# Patient Record
Sex: Male | Born: 2000 | Race: Black or African American | Hispanic: No | Marital: Single | Smoking: Never smoker
Health system: Southern US, Community
[De-identification: ages and names within clinical notes are randomized; demographics above are authoritative.]

## PROBLEM LIST (undated history)

## (undated) HISTORY — PX: ORCHIOPEXY: SHX479

---
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
## 2018-01-25 ENCOUNTER — Encounter: Payer: Self-pay | Admitting: Sports Medicine

## 2018-01-25 ENCOUNTER — Ambulatory Visit (INDEPENDENT_AMBULATORY_CARE_PROVIDER_SITE_OTHER): Payer: 59 | Admitting: Sports Medicine

## 2018-01-25 DIAGNOSIS — M2142 Flat foot [pes planus] (acquired), left foot: Secondary | ICD-10-CM | POA: Diagnosis not present

## 2018-01-25 DIAGNOSIS — M2141 Flat foot [pes planus] (acquired), right foot: Secondary | ICD-10-CM

## 2018-01-25 NOTE — Patient Instructions (Signed)
Today we made custom orthotics. Try them out in your basketball shoes. If you are still having foot pain after 1-2 weeks, come back and we can make some minor alterations in them. Good luck in this year's basketball season.

## 2018-01-25 NOTE — Assessment & Plan Note (Signed)
Patient was fitted for a : standard, cushioned, semi-rigid orthotic. The orthotic was heated and afterward the patient stood on the orthotic blank positioned on the orthotic stand. The patient was positioned in subtalar neutral position and 10 degrees of ankle dorsiflexion in a weight bearing stance. After completion of molding, a stable base was applied to the orthotic blank. The blank was ground to a stable position for weight bearing. Size: 14 Base: EVA Posting: none Additional orthotic padding: none  Total time spent with the patient was 30 minutes with greater than 50% of the time spent in face-to-face consultation doing gait analysis, construction, and customization of custom orthotics.

## 2018-01-25 NOTE — Progress Notes (Signed)
  Devin Hernandez - 17 y.o. 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  He has a history of pes planus which he has required custom orthotics for.  His current orthotics are 362 to 19 years old and are worn out.  He states that he is beginning to feel pain on the medial longitudinal arch on his feet bilaterally.  This is worse with toe off exercises and jumping up for a rebound in basketball.  It does improve with relative rest, ice and anti-inflammatory medications.  He denies any significant ankle pain or ankle instability.  Denies any calf tenderness or recent injuries in basketball.  He expresses the desire to have custom orthotics made today.  Denies any numbness or tingling of his extremities.  Denies any knee pain.   Review of systems:  As stated above   Interval past medical history, surgical history, family history, and social history obtained and are unchanged.   He did have a history of low back pain which has resolved.  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  Family history is significant for hypertension and diabetes.  Surgical history significant for a lateral meniscectomy and left knee arthroscopy in 2017.  Medications reviewed and unchanged. Allergies: No known drug allergies  OBJECTIVE:  Physical exam: Vital signs are reviewed. BP 120/70   Ht 6\' 5"  (1.956 m)   Wt 222 lb (100.7 kg)   BMI 26.33 kg/m   Gen.: Alert, oriented, appears stated age, in no apparent distress Gait: normal without associated limp Musculoskeletal: Inspection of bilateral feet demonstrates total collapse of medial longitudinal arch with weight bearing. Midfoot medial arch tenderness to palpation. Full ankle ROM in  dorsiflexion/plantarflexion. Strength 5/5 in DF/PF.  Able to go up on his forefeet without any significant problems.  Neurovascularly intact.    ASSESSMENT & PLAN: Bilateral pes planus Patient was fitted for a : standard, cushioned, semi-rigid orthotic. The orthotic was heated and afterward the patient stood on the orthotic blank positioned on the orthotic stand. The patient was positioned in subtalar neutral position and 10 degrees of ankle dorsiflexion in a weight bearing stance. After completion of molding, a stable base was applied to the orthotic blank. The blank was ground to a stable position for weight bearing. Size: 14 Base: EVA Posting: none Additional orthotic padding: none  Total time spent with the patient was 25 minutes with greater than 50% of the time spent in face-to-face consultation doing gait analysis, construction, and customization of custom orthotics.    Gustavus MessingAJ Anniyah Mood, DO Sports Medicine Fellow [facility]

## 2018-05-13 ENCOUNTER — Encounter (HOSPITAL_COMMUNITY): Payer: Self-pay | Admitting: *Deleted

## 2018-05-13 ENCOUNTER — Other Ambulatory Visit: Payer: Self-pay

## 2018-05-13 ENCOUNTER — Emergency Department (HOSPITAL_COMMUNITY)
Admission: EM | Admit: 2018-05-13 | Discharge: 2018-05-13 | Disposition: A | Discharge status: Home / Self Care | Payer: 59 | Attending: Emergency Medicine | Admitting: Emergency Medicine

## 2018-05-13 DIAGNOSIS — H6692 Otitis media, unspecified, left ear: Secondary | ICD-10-CM | POA: Diagnosis not present

## 2018-05-13 DIAGNOSIS — Z79899 Other long term (current) drug therapy: Secondary | ICD-10-CM | POA: Insufficient documentation

## 2018-05-13 DIAGNOSIS — R001 Bradycardia, unspecified: Secondary | ICD-10-CM | POA: Diagnosis not present

## 2018-05-13 DIAGNOSIS — H9202 Otalgia, left ear: Secondary | ICD-10-CM | POA: Diagnosis present

## 2018-05-13 MED ORDER — IBUPROFEN 200 MG PO TABS
600.0000 mg | ORAL_TABLET | Freq: Once | ORAL | Status: AC
  Administered 2018-05-13: 600 mg via ORAL
  Filled 2018-05-13: qty 3

## 2018-05-13 MED ORDER — PSEUDOEPHEDRINE HCL 60 MG PO TABS
30.0000 mg | ORAL_TABLET | Freq: Once | ORAL | Status: AC
  Administered 2018-05-13: 30 mg via ORAL
  Filled 2018-05-13: qty 1

## 2018-05-13 MED ORDER — AMOXICILLIN 500 MG PO CAPS: 500.0000 mg | ORAL_CAPSULE | Freq: Three times a day (TID) | ORAL | 0 refills | Status: AC

## 2018-05-13 MED ORDER — AMOXICILLIN 500 MG PO CAPS
500.0000 mg | ORAL_CAPSULE | Freq: Once | ORAL | Status: AC
  Administered 2018-05-13: 500 mg via ORAL
  Filled 2018-05-13: qty 1

## 2018-05-13 MED ORDER — PSEUDOEPHEDRINE HCL 30 MG PO TABS: 30.0000 mg | ORAL_TABLET | Freq: Four times a day (QID) | ORAL | 0 refills | Status: AC | PRN

## 2018-05-13 NOTE — ED Provider Notes (Signed)
[facility] DEPT Provider Note   CSN: [id number] Arrival date & time: 05/13/18  1912     History   Chief Complaint Chief Complaint  Patient presents with  . Otalgia    left    HPI Devin Hernandez is a 18 y.o. male who presents to the ED for left ear pain and nasal congestion. Patient reports he had an away ball game today and went to sleep on the bus and when he woke the congestion was worse and the ear ache was really bad.   HPI  History reviewed. No pertinent past medical history.  Patient Active Problem List   Diagnosis Date Noted  . Bilateral pes planus 01/25/2018  . Acute left-sided low back pain without sciatica 04/27/2017    Past Surgical History:  Procedure Laterality Date  . KNEE ARTHROSCOPY WITH LATERAL MENISECTOMY Left 03/11/2016   Procedure: KNEE ARTHROSCOPY WITH LATERAL [place];  Surgeon: Ninetta Lights, MD;  Location: [facility];  Service: Orthopedics;  Laterality: Left;  . ORCHIOPEXY          Home Medications    Prior to Admission medications   Medication Sig Start Date End Date Taking? Authorizing Provider  amoxicillin (AMOXIL) 500 MG capsule Take 1 capsule (500 mg total) by mouth 3 (three) times daily. 05/13/18   Ashley Murrain, NP  Biotin 1000 MCG tablet Take 1,000 mcg by mouth daily.    [provider]  chlorhexidine gluconate, MEDLINE KIT, (PERIDEX) 0.12 % solution Use as directed 15 mLs in the mouth or throat 2 (two) times daily. 05/26/17   Mackuen, Courteney Lyn, MD  Multiple Vitamins-Minerals (MULTIVITAMIN ADULT PO) Take 1 tablet by mouth daily.    [provider]  naproxen (NAPROSYN) 500 MG tablet Take 1 tablet (500 mg total) by mouth 2 (two) times daily as needed. Patient not taking: Reported on 05/26/2017 04/27/17   McKeag, Marylynn Pearson, MD  Omega-3 Fatty Acids (FISH OIL) 1000 MG CAPS Take 1,000 mg by mouth daily.    [provider]  pseudoephedrine (SUDAFED) 30 MG tablet Take  1 tablet (30 mg total) by mouth every 6 (six) hours as needed for congestion. 05/13/18   Ashley Murrain, NP    Family History Family History  Problem Relation Age of Onset  . Hypertension Father   . Diabetes Father   . Hypertension Maternal Grandfather   . Hypertension Paternal Grandfather   . Diabetes Paternal Grandfather     Social History Social History   Tobacco Use  . Smoking status: Never Smoker  . Smokeless tobacco: Never Used  Substance Use Topics  . Alcohol use: Yes    Comment: occ  . Drug use: No     Allergies   Patient has no known allergies.   Review of Systems Review of Systems  HENT: Positive for congestion and ear pain.   All other systems reviewed and are negative.    Physical Exam Updated Vital Signs BP (!) 143/55 (BP Location: Left Arm)   Pulse (!) 52   Temp (!) 97.5 F (36.4 C) (Oral)   Resp 16   Ht '6\' 5"'  (1.956 m)   Wt 101.2 kg   SpO2 98%   BMI 26.44 kg/m   Physical Exam Vitals signs and nursing note reviewed.  Constitutional:      General: He is not in acute distress.    Appearance: He is well-developed.  HENT:     Head: Normocephalic.     Right  Ear: Tympanic membrane normal.     Left Ear: Tympanic membrane is erythematous and bulging.     Nose: Congestion present.     Mouth/Throat:     Pharynx: Posterior oropharyngeal erythema present.  Eyes:     Conjunctiva/sclera: Conjunctivae normal.  Neck:     Musculoskeletal: Neck supple.  Cardiovascular:     Rate and Rhythm: Bradycardia present.  Pulmonary:     Effort: Pulmonary effort is normal.  Musculoskeletal: Normal range of motion.  Skin:    General: Skin is warm and dry.  Neurological:     Mental Status: He is alert and oriented to person, place, and time.  Psychiatric:        Mood and Affect: Mood normal.      ED Treatments / Results  Labs (all labs ordered are listed, but only abnormal results are displayed) Labs Reviewed - No data to display  Radiology No results  found.  Procedures Procedures (including critical care time)  Medications Ordered in ED Medications  amoxicillin (AMOXIL) capsule 500 mg (has no administration in time range)  pseudoephedrine (SUDAFED) tablet 30 mg (has no administration in time range)     Initial Impression / Assessment and Plan / ED Course  I have reviewed the triage vital signs and the nursing notes.  Patient presents with otalgia and exam consistent with acute otitis media. No concern for acute mastoiditis, meningitis. No antibiotic use in the last month.  Patient discharged home with Amoxicillin.  Advised patient to call PCP for follow-up.  I have also discussed reasons to return immediately to the ER.  Patient expresses understanding and agrees with plan.   Final Clinical Impressions(s) / ED Diagnoses   Final diagnoses:  Acute otitis media, left    ED Discharge Orders         Ordered    amoxicillin (AMOXIL) 500 MG capsule  3 times daily     05/13/18 1937    pseudoephedrine (SUDAFED) 30 MG tablet  Every 6 hours PRN     05/13/18 1937           Debroah Baller [place], NP 05/13/18 1942    Lacretia Leigh, MD 05/13/18 2100

## 2018-05-13 NOTE — ED Triage Notes (Signed)
Pt c/o left ear pain that started today.  Pt presents with nasal congestion also.

## 2018-05-13 NOTE — Discharge Instructions (Addendum)
Follow up for recheck on one week. Return here sooner for any problems. Take tylenol and ibuprofen as needed for pain.

## 2019-02-10 IMAGING — MR MR LUMBAR SPINE W/O CM
4 of 7 series · 21 of 48 positions shown · non-contrast
Comparison: Radiographs dated 04/27/2017

CLINICAL DATA: Acute left-sided low back pain.

EXAM:
MRI LUMBAR SPINE WITHOUT CONTRAST
TECHNIQUE: Multiplanar, multisequence MR imaging of the lumbar spine was
performed. No intravenous contrast was administered.

[Series 16: T2 · sagittal · 4.0mm · 0.73mm/px · 4 of 15 slices shown (1 of 2)]
[im 1/15]
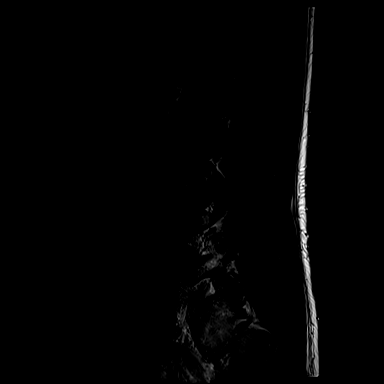
[im 5/15]
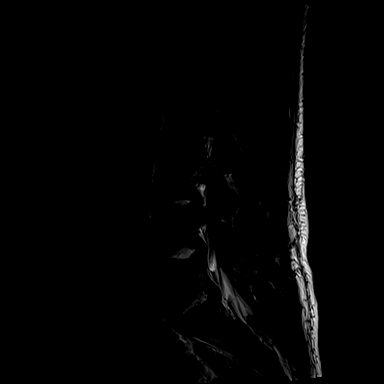
[im 10/15]
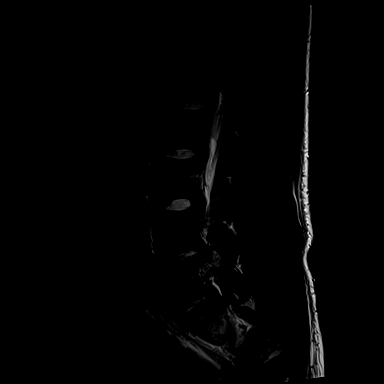
[im 15/15]
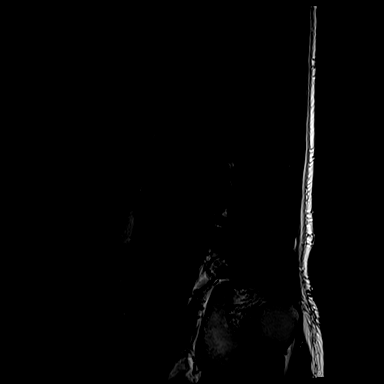

[Series 17: T1 · sagittal · 4.0mm · 0.73mm/px · 5 of 15 slices shown (1 of 2)]
[im 1/15]
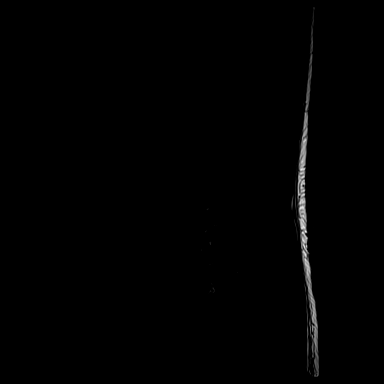
[im 4/15]
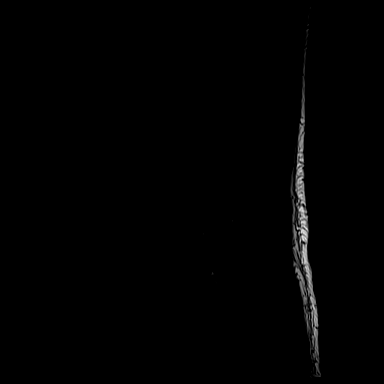
[im 8/15]
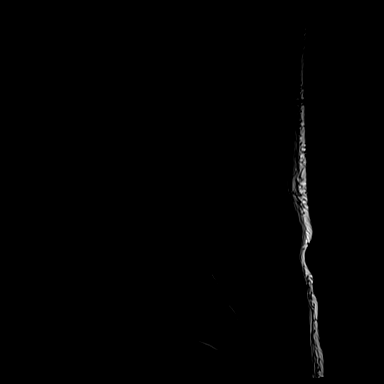
[im 11/15]
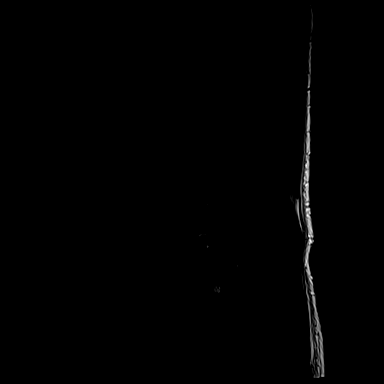
[im 15/15]
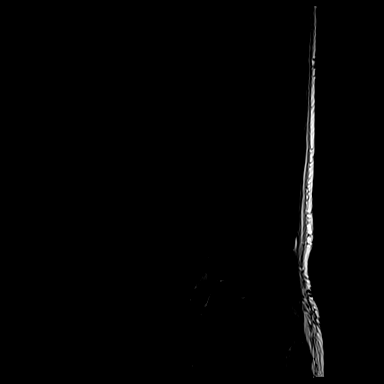

[Series 28: T1 · sagittal · 4.0mm · 0.73mm/px · 3 of 15 slices shown (2 of 2)]
[im 1/15]
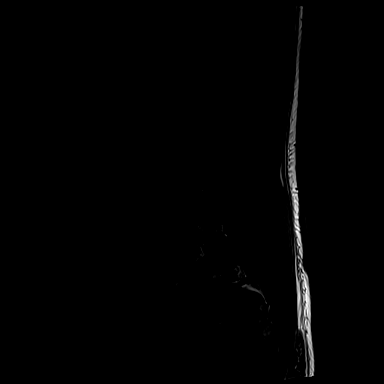
[im 8/15]
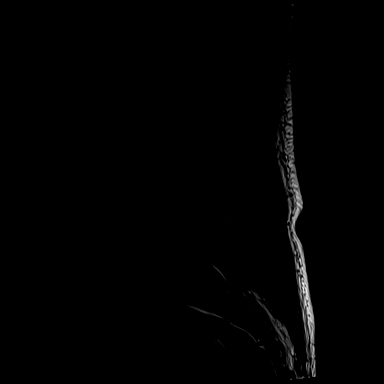
[im 15/15]
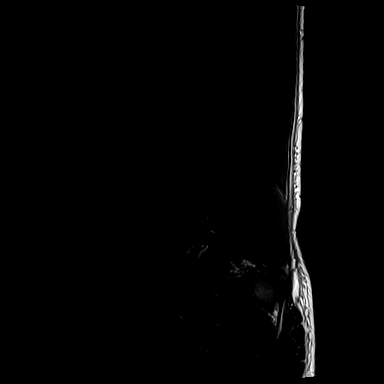

[Series 30: T2 · axial · 4.0mm · 0.28mm/px · z∈[-150,+44]mm · 9 of 36 slices shown (2 of 2)]
[im 1/36]
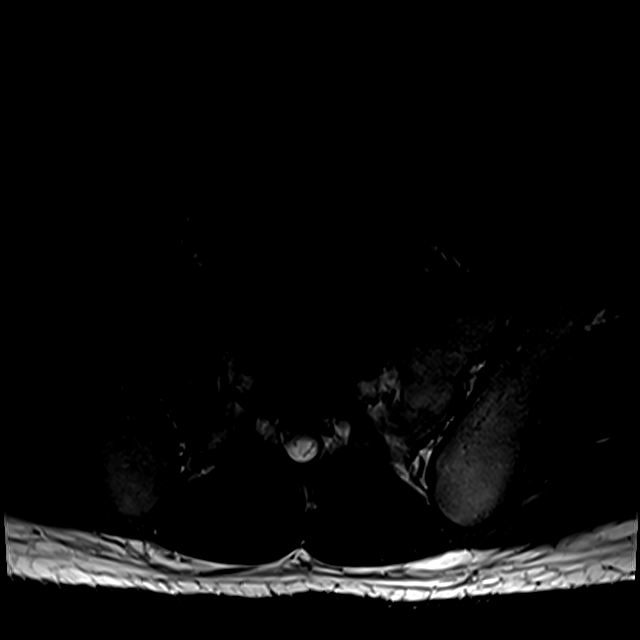
[im 7/36]
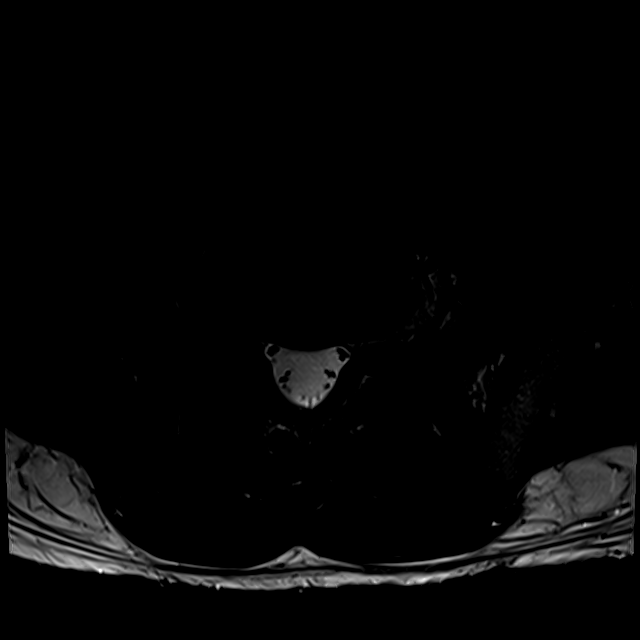
[im 10/36]
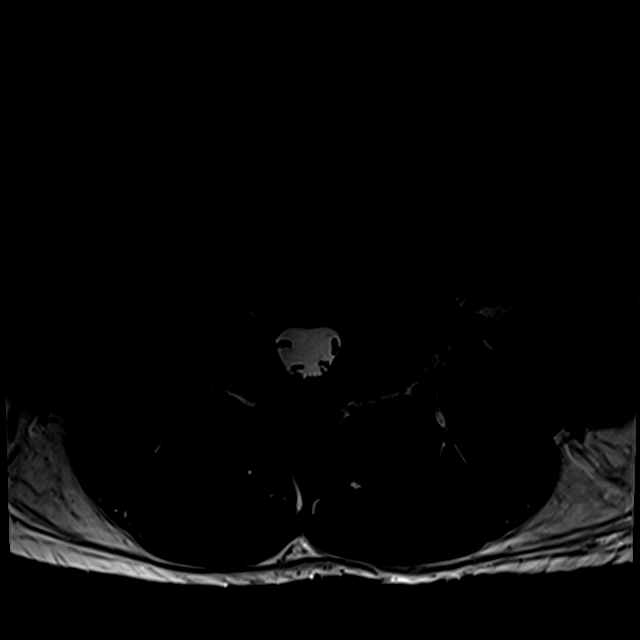
[im 16/36]
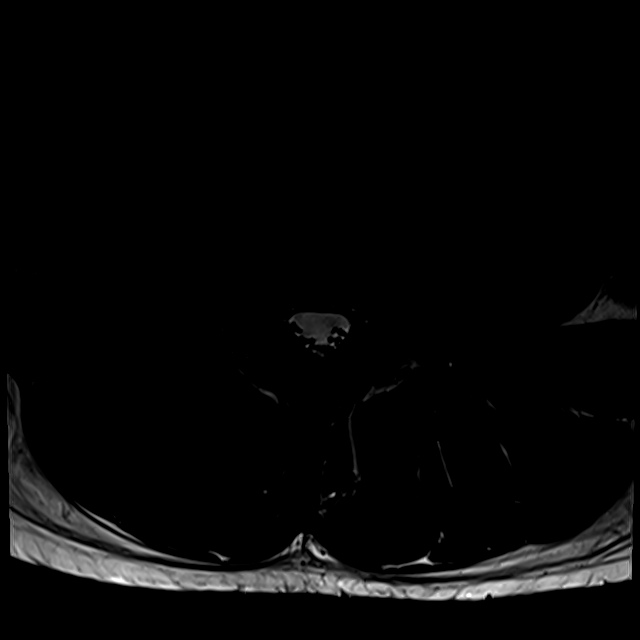
[im 20/36]
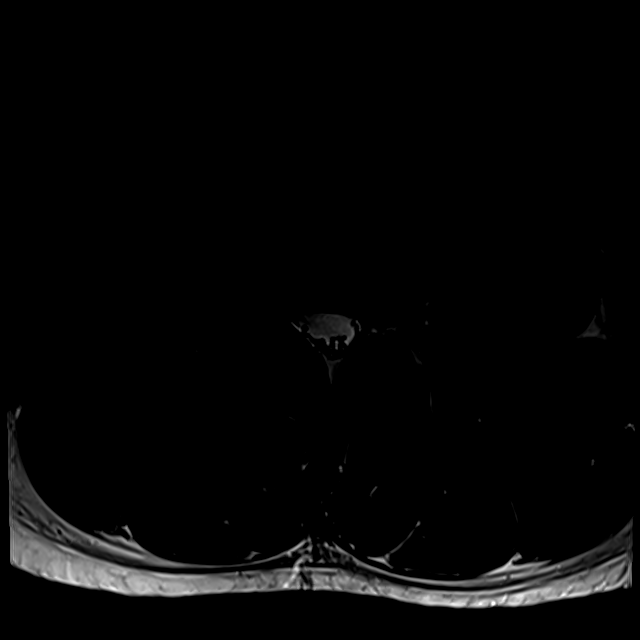
[im 26/36]
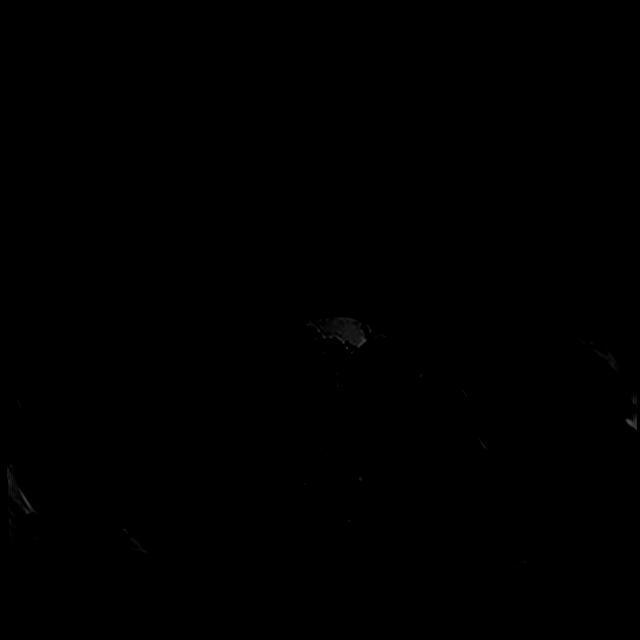
[im 29/36]
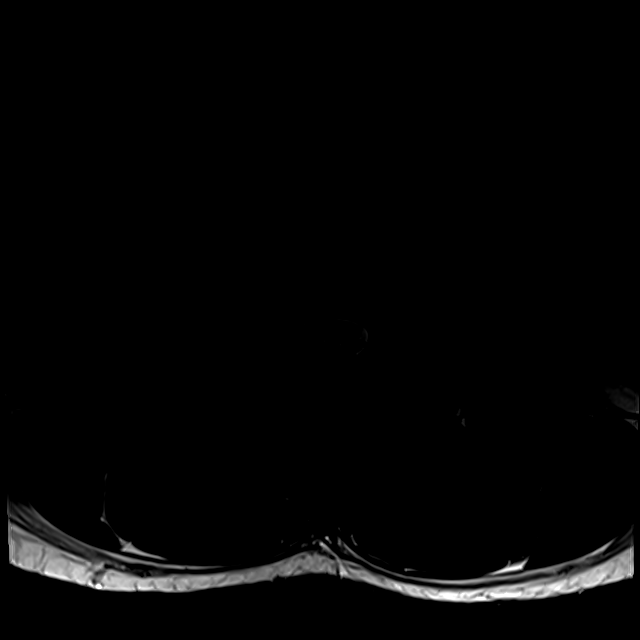
[im 32/36]
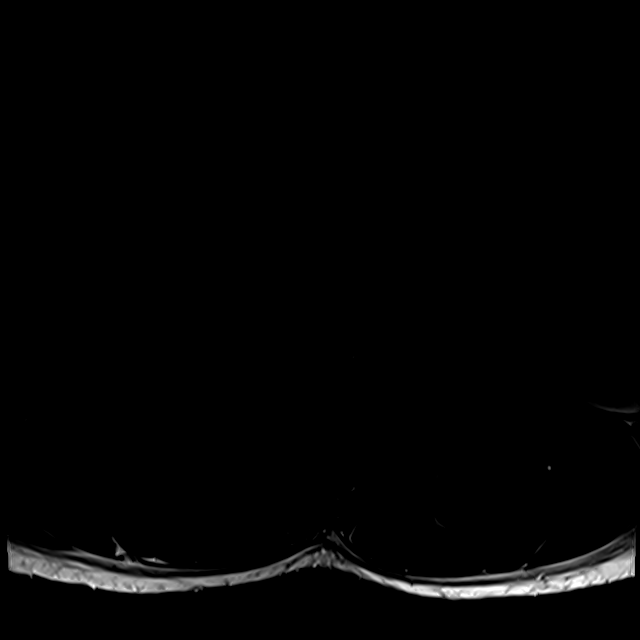
[im 36/36]
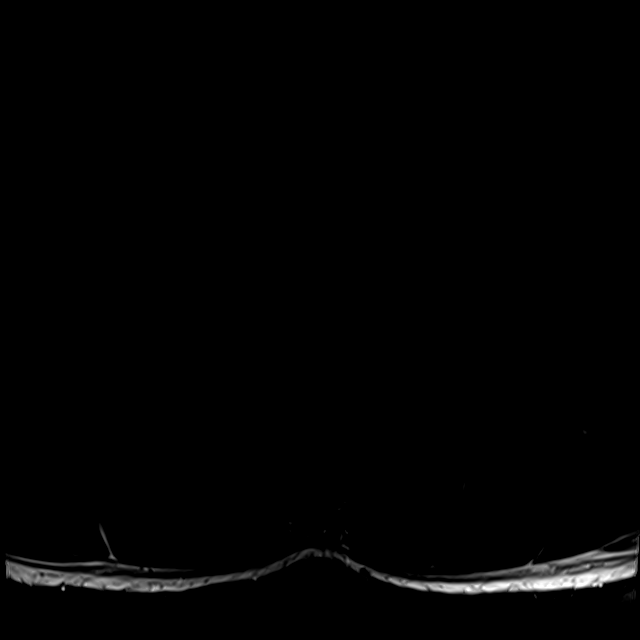

[21 of 48 positions shown; findings below may reference images not displayed]

FINDINGS: Segmentation:  Standard.

Alignment:  Slight lumbar levoscoliosis.  No spondylolisthesis.

Vertebrae: No fracture, evidence of discitis, or significant bone
lesion.

Conus medullaris and cauda equina: Tip of the conus appears to be
above L1. Cauda equina appears normal.

Paraspinal and other soft tissues: Negative.

Disc levels:

L1-2: Normal.

L2-3: Normal disc. Slight increased signal intensity on T2 weighted
imaging in the right pedicle and right transverse process. I think
this represents a small benign hemangioma.

L3-4: Normal.

L4-5: Normal.

L5-S1: Normal.
IMPRESSION: Slight lumbar scoliosis. Otherwise, essentially normal lumbar spine.

## 2019-02-26 ENCOUNTER — Other Ambulatory Visit: Payer: Self-pay

## 2019-02-26 ENCOUNTER — Ambulatory Visit
Admission: RE | Admit: 2019-02-26 | Discharge: 2019-02-26 | Disposition: A | Discharge status: Home / Self Care | Payer: 59 | Source: Ambulatory Visit | Attending: Family Medicine | Admitting: Family Medicine

## 2019-02-26 ENCOUNTER — Ambulatory Visit (INDEPENDENT_AMBULATORY_CARE_PROVIDER_SITE_OTHER): Payer: 59 | Admitting: Family Medicine

## 2019-02-26 VITALS — BP 128/80 | Ht 77.0 in | Wt 230.0 lb

## 2019-02-26 DIAGNOSIS — M25562 Pain in left knee: Secondary | ICD-10-CM | POA: Diagnosis not present

## 2019-02-26 NOTE — Patient Instructions (Addendum)
It was great to meet you today! Thank you for letting me participate in your care!  Today, we discussed your left knee pain and inability to flex or extend it. It is most likely due to a meniscal tear in your left knee. We are ordering an MRI at:  [facility] Friday October 23rd at 58p Arrival time is 7p  We will call you with the results once they are available.    Be well, Harolyn Rutherford, DO PGY-3, [facility]

## 2019-02-26 NOTE — Assessment & Plan Note (Signed)
Given method of injury, hearing a "pop" and now knee locking with pain highly suspicious of a lateral meniscus tear. X-rays today were unremarkable for any loose body in the joint. - Obtain MRI, call patient with results are referral to [facility] if MRI consistent with lateral meniscus tear.

## 2019-02-26 NOTE — Progress Notes (Signed)
     Subjective: HPI: Devin Hernandez is a 18 y.o. presenting to clinic today to discuss the following:  Left Knee Pain and "locking" Patient is a 18y/o male with PMH of lateral meniscus tear and partial meniscectomy in 2017 with Dr. Amada Jupiter. He states today he went to go sit down in a "criss cross applesauce" manner and felt his left knee "pop". Immediately afterward, he had sharp pain, possibly some mild swelling, and he could no longer flex or extend his left knee. It felt locked in 45 degrees of flexion. He has tenderness along the lateral portion and back of his knee. No bruising, no loss of sensation. It is painful to bear weight on the left leg.  ROS noted in HPI.   Social History   Tobacco Use  Smoking Status Never Smoker  Smokeless Tobacco Never Used   Objective: BP 128/80   Ht 6\' 5"  (1.956 m)   Wt 230 lb (104.3 kg)   BMI 27.27 kg/m  Vitals and nursing notes reviewed  Physical Exam Knee, Left: TTP noted at the Lateral Joint line and in the popliteal region of the left knee. Inspection was negative for erythema, ecchymosis, and effusion. No obvious bony abnormalities or signs of osteophyte development. Palpation yielded no asymmetric warmth; Positive for lateral joint line tenderness; No condyle tenderness; No patellar tenderness; Patellar and quadriceps tendons unremarkable, and no tenderness of the pes anserine bursa. No obvious Baker's cyst development. ROM markedly decreased in flexion and extension. Patient is unable to move knee out of 45 degrees of flexion both active and passive. 5/5 hamstring and quadriceps strength. Neurovascularly intact bilaterally. Negative valgus and varus testing.  Negative lachman.    Right knee: No deformity, instability. FROM with 5/5 strength. No tenderness to palpation. NVI distally.  Assessment/Plan:  Left knee pain Given method of injury, hearing a "pop" and now knee locking with pain highly suspicious of a lateral meniscus tear.  X-rays today were unremarkable for any loose body in the joint. - Obtain MRI, call patient with results are referral to [facility] if MRI consistent with lateral meniscus tear.  PATIENT EDUCATION PROVIDED: See AVS    Diagnosis and plan along with any newly prescribed medication(s) were discussed in detail with this patient today. The patient verbalized understanding and agreed with the plan. Patient advised if symptoms worsen return to clinic or ER.    Orders Placed This Encounter  Procedures  . DG Knee Complete 4 Views Left    Standing Status:   Future    Number of Occurrences:   1    Standing Expiration Date:   04/27/2020    Order Specific Question:   Reason for Exam (SYMPTOM  OR DIAGNOSIS REQUIRED)    Answer:   Left knee pain with views to be: AP/LAT/SUNRISE/TUNNEL    Order Specific Question:   Preferred imaging location?    Answer:   [facility]    Order Specific Question:   Radiology Contrast Protocol - do NOT remove file path    Answer:   [email or web address]    Devin Rutherford, DO 02/26/2019, 2:24 PM PGY-3 [place]

## 2019-02-27 ENCOUNTER — Encounter: Payer: Self-pay | Admitting: Family Medicine

## 2019-03-01 ENCOUNTER — Encounter: Payer: Self-pay | Admitting: Family Medicine

## 2019-03-05 ENCOUNTER — Ambulatory Visit: Payer: 59 | Admitting: Family Medicine

## 2019-06-15 ENCOUNTER — Other Ambulatory Visit: Payer: Self-pay

## 2019-06-15 ENCOUNTER — Encounter: Payer: Self-pay | Admitting: Sports Medicine

## 2019-06-15 ENCOUNTER — Ambulatory Visit (INDEPENDENT_AMBULATORY_CARE_PROVIDER_SITE_OTHER): Payer: 59 | Admitting: Sports Medicine

## 2019-06-15 VITALS — BP 112/80 | Ht 77.0 in | Wt 220.0 lb

## 2019-06-15 DIAGNOSIS — Z09 Encounter for follow-up examination after completed treatment for conditions other than malignant neoplasm: Secondary | ICD-10-CM | POA: Diagnosis not present

## 2019-06-15 DIAGNOSIS — U071 COVID-19: Secondary | ICD-10-CM

## 2019-06-15 DIAGNOSIS — Z8616 Personal history of COVID-19: Secondary | ICD-10-CM | POA: Diagnosis not present

## 2019-06-15 NOTE — Progress Notes (Addendum)
PCP: [name], MD (Inactive)  Subjective:   HPI: Patient is a 19 y.o. male here for sports clearance following Covid infection.  Patient was diagnosed a week and a half ago with Covid.  Patient spiked a fever of 102 for 1 day.  The fever resolved the next day.  Patient had a mild cough that lasted several days.  He did not have any shortness of breath fatigue or chills.  Patient denies any current symptoms.  He has started exercising since he was diagnosed with Covid.  He did not get any symptoms with exercise.  Patient is a Associate Professor at [employer].   Review of Systems: See HPI above.  History reviewed. No pertinent past medical history.  Current Outpatient Medications on File Prior to Visit  Medication Sig Dispense Refill  . amoxicillin (AMOXIL) 500 MG capsule Take 1 capsule (500 mg total) by mouth 3 (three) times daily. (Patient not taking: Reported on 06/15/2019) 30 capsule 0  . Biotin 1000 MCG tablet Take 1,000 mcg by mouth daily.    . chlorhexidine gluconate, MEDLINE KIT, (PERIDEX) 0.12 % solution Use as directed 15 mLs in the mouth or throat 2 (two) times daily. (Patient not taking: Reported on 06/15/2019) 120 mL 0  . Multiple Vitamins-Minerals (MULTIVITAMIN ADULT PO) Take 1 tablet by mouth daily.    . naproxen (NAPROSYN) 500 MG tablet Take 1 tablet (500 mg total) by mouth 2 (two) times daily as needed. (Patient not taking: Reported on 05/26/2017) 60 tablet 2  . Omega-3 Fatty Acids (FISH OIL) 1000 MG CAPS Take 1,000 mg by mouth daily.    . pseudoephedrine (SUDAFED) 30 MG tablet Take 1 tablet (30 mg total) by mouth every 6 (six) hours as needed for congestion. (Patient not taking: Reported on 06/15/2019) 30 tablet 0   No current facility-administered medications on file prior to visit.    Past Surgical History:  Procedure Laterality Date  . KNEE ARTHROSCOPY WITH LATERAL MENISECTOMY Left 03/11/2016   Procedure: KNEE ARTHROSCOPY WITH LATERAL [place];  Surgeon:  Ninetta Lights, MD;  Location: [facility];  Service: Orthopedics;  Laterality: Left;  . ORCHIOPEXY      No Known Allergies  Social History   Socioeconomic History  . Marital status: Single    Spouse name: Not on file  . Number of children: Not on file  . Years of education: Not on file  . Highest education level: Not on file  Occupational History  . Not on file  Tobacco Use  . Smoking status: Never Smoker  . Smokeless tobacco: Never Used  Substance and Sexual Activity  . Alcohol use: Yes    Comment: occ  . Drug use: No  . Sexual activity: Not on file  Other Topics Concern  . Not on file  Social History Narrative   Lives with Mom   Social Determinants of Health   Financial Resource Strain:   . Difficulty of Paying Living Expenses: Not on file  Food Insecurity:   . Worried About Charity fundraiser in the Last Year: Not on file  . Ran Out of Food in the Last Year: Not on file  Transportation Needs:   . Lack of Transportation (Medical): Not on file  . Lack of Transportation (Non-Medical): Not on file  Physical Activity:   . Days of Exercise per Week: Not on file  . Minutes of Exercise per Session: Not on file  Stress:   . Feeling of Stress : Not on  file  Social Connections:   . Frequency of Communication with Friends and Family: Not on file  . Frequency of Social Gatherings with Friends and Family: Not on file  . Attends Religious Services: Not on file  . Active Member of Clubs or Organizations: Not on file  . Attends Archivist Meetings: Not on file  . Marital Status: Not on file  Intimate Partner Violence:   . Fear of Current or Ex-Partner: Not on file  . Emotionally Abused: Not on file  . Physically Abused: Not on file  . Sexually Abused: Not on file    Family History  Problem Relation Age of Onset  . Hypertension Father   . Diabetes Father   . Hypertension Maternal Grandfather   . Hypertension Paternal Grandfather   . Diabetes  Paternal Grandfather         Objective:  Physical Exam: BP 112/80   Ht '6\' 5"'  (1.956 m)   Wt 220 lb (99.8 kg)   BMI 26.09 kg/m  Gen: NAD, comfortable in exam room Lungs: Lungs clear to auscultation bilaterally.  No wheezes or crackles Heart: No murmurs, rubs or gallops.  Normal S1, S2    Assessment & Plan:  Patient is a 19 y.o. male here for sports clearance following Covid infection  1.  Status post COVID-19 infection -Patient falls in moderate symptom category.  Since he is started exercising without symptoms he is cleared to return to sport.  No further testing is needed -Patient was advised to monitor for shortness of breath, fatigue, chest pain, palpitations.  If he develops any of these he will let the athletic trainer [place] no will then contact me  Patient will follow up in as-needed basis  I was the preceptor for this visit and available for immediate consultation Shellia Cleverly, DO

## 2020-04-25 ENCOUNTER — Telehealth: Payer: Self-pay | Admitting: Oncology

## 2020-04-25 NOTE — Telephone Encounter (Signed)
Called to Discuss with patient about Covid symptoms and the use of the monoclonal antibody infusion for those with mild to moderate Covid symptoms and at a high risk of hospitalization.     Pt is qualified for this infusion due to co-morbid conditions and/or a member of an at-risk group.    Eligibility-[facility]   Patient Active Problem List   Diagnosis Date Noted  . Left knee pain 02/26/2019  . Bilateral pes planus 01/25/2018  . Acute left-sided low back pain without sciatica 04/27/2017    Patient appears to be feeling much better since he was diagnosed.  Symptom onset was 04/20/2020.  He tested positive on 04/23/2020.  If his symptoms worsen, I told him to call the clinic and we could schedule him for an infusion.  He is eligible for the infusion until 04/29/2020.    Symptoms tier reviewed as well as criteria for ending isolation. Preventative practices reviewed. Patient verbalized understanding.   Patient advised to call back if he/she decides that he/she does want to get infusion. Callback number to the infusion center given. Patient advised to go to Urgent care or ED with severe symptoms.   Durenda Hurt, NP 04/25/2020 1:38 PM

## 2024-01-31 ENCOUNTER — Encounter: Payer: Self-pay | Admitting: Dermatology

## 2024-01-31 ENCOUNTER — Ambulatory Visit: Admitting: Dermatology

## 2024-01-31 VITALS — BP 76/62

## 2024-01-31 DIAGNOSIS — L810 Postinflammatory hyperpigmentation: Secondary | ICD-10-CM

## 2024-01-31 DIAGNOSIS — L700 Acne vulgaris: Secondary | ICD-10-CM | POA: Diagnosis not present

## 2024-01-31 MED ORDER — CLINDAMYCIN PHOSPHATE 1 % EX SWAB: 1.0000 | Freq: Every day | CUTANEOUS | 2 refills | Status: AC

## 2024-01-31 MED ORDER — TRETINOIN 0.025 % EX CREA: TOPICAL_CREAM | CUTANEOUS | 2 refills | Status: AC

## 2024-01-31 NOTE — Patient Instructions (Addendum)
 VISIT SUMMARY:  You came in today to discuss your acne management. Your acne primarily occurs on one side of your face about once a month and worsened. You are currently using Curology products but are unsure of the ingredients, and you previously used tretinoin  twice daily, which caused significant skin dryness.  YOUR PLAN:  -ACNE VULGARIS:  Acne vulgaris is a common skin condition that causes pimples and can lead to scarring and dark spots. You should stop using Curology products unless you can verify they are compatible with your skin. Instead, use a gentle cleanser like CeraVe. In the morning, apply clindamycin  Pledgets, followed by moisturizing with CeraVe cream and then Avene Tolerance. At night, apply a pea-sized amount of tretinoin  three nights a week, and reduce to two nights in colder weather if your skin becomes sensitive. We will follow up in four months to see how your skin is doing, and we may consider oral medication if your acne does not improve. Please return sooner if you experience a rebound or uncontrolled flare-up.  INSTRUCTIONS:  Follow up in four months to assess your progress. Return sooner if you experience a rebound or uncontrolled flare-up.  Important Information  Due to recent changes in healthcare laws, you may see results of your pathology and/or laboratory studies on MyChart before the doctors have had a chance to review them. We understand that in some cases there may be results that are confusing or concerning to you. Please understand that not all results are received at the same time and often the doctors may need to interpret multiple results in order to provide you with the best plan of care or course of treatment. Therefore, we ask that you please give us  2 business days to thoroughly review all your results before contacting the office for clarification. Should we see a critical lab result, you will be contacted sooner.   If You Need Anything After Your  Visit  If you have any questions or concerns for your doctor, please call our main line at [phone] If no one answers, please leave a voicemail as directed and we will return your call as soon as possible. Messages left after 4 pm will be answered the following business day.   You may also send us  a message via MyChart. We typically respond to MyChart messages within 1-2 business days.  For prescription refills, please ask your pharmacy to contact our office. Our fax number is [phone].  If you have an urgent issue when the clinic is closed that cannot wait until the next business day, you can page your doctor at the number below.    Please note that while we do our best to be available for urgent issues outside of office hours, we are not available 24/7.   If you have an urgent issue and are unable to reach us , you may choose to seek medical care at your doctor's office, retail clinic, urgent care center, or emergency room.  If you have a medical emergency, please immediately call 911 or go to the emergency department. In the event of inclement weather, please call our main line at [phone] for an update on the status of any delays or closures.  Dermatology Medication Tips: Please keep the boxes that topical medications come in in order to help keep track of the instructions about where and how to use these. Pharmacies typically print the medication instructions only on the boxes and not directly on the medication tubes.   If your medication  is too expensive, please contact our office at [phone] or send us  a message through MyChart.   We are unable to tell what your co-pay for medications will be in advance as this is different depending on your insurance coverage. However, we may be able to find a substitute medication at lower cost or fill out paperwork to get insurance to cover a needed medication.   If a prior authorization is required to get your medication covered by  your insurance company, please allow us  1-2 business days to complete this process.  Drug prices often vary depending on where the prescription is filled and some pharmacies may offer cheaper prices.  The website www.goodrx.com contains coupons for medications through different pharmacies. The prices here do not account for what the cost may be with help from insurance (it may be cheaper with your insurance), but the website can give you the price if you did not use any insurance.  - You can print the associated coupon and take it with your prescription to the pharmacy.  - You may also stop by our office during regular business hours and pick up a GoodRx coupon card.  - If you need your prescription sent electronically to a different pharmacy, notify our office through [facility] or by phone at [phone]

## 2024-01-31 NOTE — Progress Notes (Unsigned)
   New Patient Visit   Subjective  Devin Hernandez is a 23 y.o. male who presents for a NEW PATIENT appointment to be examined for the concerns as listed below.   Acne: located at the face that presented 74mo ago. Break outs are not painful. He is not currently using any OTC or Rx for acne concerns and looking for a regimen and product recommendations.     No Hx of Bx. No family Hx of skin cancer .   The following portions of the chart were reviewed this encounter and updated as appropriate: medications, allergies, medical history  Review of Systems:  No other skin or systemic complaints except as noted in HPI or Assessment and Plan.  Objective  Well appearing patient in no apparent distress; mood and affect are within normal limits.   A focused examination was performed of the following areas: face   Relevant exam findings are noted in the Assessment and Plan.          Assessment & Plan   Acne vulgaris and PIH Intermittent flare-ups with scarring and hyperpigmentation. Current regimen ineffective compared to prescription options.  - Discontinue Curology products unless verified for compatibility. - Use gentle cleanser like CeraVe. - Apply clindamycin  Pledgets in the morning. - Moisturize with CeraVe cream and then Avene Tolerance after clindamycin . - Apply tretinoin  at night, pea-sized amount, three nights a week. Adjust to two nights in colder weather if sensitive. - Follow-up in four months to assess progress. Consider oral medication if acne persists. - Return sooner if rebound or uncontrolled flare-up occurs.   No follow-ups on file.   Documentation: I have reviewed the above documentation for accuracy and completeness, and I agree with the above.  I, Shirron Maranda, CMA, am acting as scribe for [employer], DO.   Delon Lenis, DO

## 2024-06-28 ENCOUNTER — Ambulatory Visit: Admitting: Dermatology
